# Patient Record
Sex: Male | Born: 1950 | Race: White | Hispanic: No | Smoking: Never smoker
Health system: Southern US, Community
[De-identification: ages and names within clinical notes are randomized; demographics above are authoritative.]

## PROBLEM LIST (undated history)

## (undated) DIAGNOSIS — F32A Depression, unspecified: Secondary | ICD-10-CM

## (undated) DIAGNOSIS — J45909 Unspecified asthma, uncomplicated: Secondary | ICD-10-CM

## (undated) DIAGNOSIS — I38 Endocarditis, valve unspecified: Secondary | ICD-10-CM

## (undated) DIAGNOSIS — I499 Cardiac arrhythmia, unspecified: Secondary | ICD-10-CM

## (undated) DIAGNOSIS — M0500 Felty's syndrome, unspecified site: Secondary | ICD-10-CM

## (undated) DIAGNOSIS — R222 Localized swelling, mass and lump, trunk: Secondary | ICD-10-CM

## (undated) DIAGNOSIS — G4730 Sleep apnea, unspecified: Secondary | ICD-10-CM

## (undated) DIAGNOSIS — I739 Peripheral vascular disease, unspecified: Secondary | ICD-10-CM

## (undated) DIAGNOSIS — H04123 Dry eye syndrome of bilateral lacrimal glands: Secondary | ICD-10-CM

## (undated) DIAGNOSIS — R011 Cardiac murmur, unspecified: Secondary | ICD-10-CM

## (undated) DIAGNOSIS — H269 Unspecified cataract: Secondary | ICD-10-CM

## (undated) DIAGNOSIS — E785 Hyperlipidemia, unspecified: Secondary | ICD-10-CM

## (undated) DIAGNOSIS — D649 Anemia, unspecified: Secondary | ICD-10-CM

## (undated) DIAGNOSIS — F329 Major depressive disorder, single episode, unspecified: Secondary | ICD-10-CM

## (undated) DIAGNOSIS — N400 Enlarged prostate without lower urinary tract symptoms: Secondary | ICD-10-CM

## (undated) DIAGNOSIS — I219 Acute myocardial infarction, unspecified: Secondary | ICD-10-CM

## (undated) DIAGNOSIS — Z7952 Long term (current) use of systemic steroids: Secondary | ICD-10-CM

## (undated) DIAGNOSIS — I10 Essential (primary) hypertension: Secondary | ICD-10-CM

## (undated) DIAGNOSIS — H15102 Unspecified episcleritis, left eye: Secondary | ICD-10-CM

## (undated) DIAGNOSIS — J329 Chronic sinusitis, unspecified: Secondary | ICD-10-CM

## (undated) DIAGNOSIS — M1990 Unspecified osteoarthritis, unspecified site: Secondary | ICD-10-CM

## (undated) DIAGNOSIS — B027 Disseminated zoster: Secondary | ICD-10-CM

## (undated) DIAGNOSIS — K219 Gastro-esophageal reflux disease without esophagitis: Secondary | ICD-10-CM

## (undated) DIAGNOSIS — N189 Chronic kidney disease, unspecified: Secondary | ICD-10-CM

## (undated) DIAGNOSIS — G709 Myoneural disorder, unspecified: Secondary | ICD-10-CM

## (undated) DIAGNOSIS — L509 Urticaria, unspecified: Secondary | ICD-10-CM

## (undated) DIAGNOSIS — L88 Pyoderma gangrenosum: Secondary | ICD-10-CM

## (undated) DIAGNOSIS — I2510 Atherosclerotic heart disease of native coronary artery without angina pectoris: Secondary | ICD-10-CM

## (undated) DIAGNOSIS — D696 Thrombocytopenia, unspecified: Secondary | ICD-10-CM

## (undated) DIAGNOSIS — M310 Hypersensitivity angiitis: Secondary | ICD-10-CM

## (undated) DIAGNOSIS — Z8614 Personal history of Methicillin resistant Staphylococcus aureus infection: Secondary | ICD-10-CM

## (undated) DIAGNOSIS — H9193 Unspecified hearing loss, bilateral: Secondary | ICD-10-CM

## (undated) DIAGNOSIS — D689 Coagulation defect, unspecified: Secondary | ICD-10-CM

## (undated) HISTORY — PX: CORONARY ARTERY BYPASS GRAFT: SHX141

## (undated) HISTORY — PX: OTHER SURGICAL HISTORY: SHX169

## (undated) HISTORY — PX: CARDIAC CATHETERIZATION: SHX172

---

## 2006-05-27 ENCOUNTER — Ambulatory Visit: Payer: Self-pay | Admitting: Unknown Physician Specialty

## 2006-10-11 ENCOUNTER — Ambulatory Visit: Payer: Self-pay | Admitting: Otolaryngology

## 2006-10-20 ENCOUNTER — Ambulatory Visit: Payer: Self-pay | Admitting: Otolaryngology

## 2007-10-14 ENCOUNTER — Ambulatory Visit: Payer: Self-pay | Admitting: Otolaryngology

## 2008-06-19 ENCOUNTER — Ambulatory Visit: Payer: Self-pay | Admitting: Urology

## 2008-06-25 ENCOUNTER — Ambulatory Visit: Payer: Self-pay | Admitting: Urology

## 2009-10-05 DIAGNOSIS — Z951 Presence of aortocoronary bypass graft: Secondary | ICD-10-CM | POA: Insufficient documentation

## 2009-10-05 DIAGNOSIS — Z952 Presence of prosthetic heart valve: Secondary | ICD-10-CM | POA: Insufficient documentation

## 2010-02-14 ENCOUNTER — Observation Stay: Payer: Self-pay | Admitting: Internal Medicine

## 2010-05-20 ENCOUNTER — Encounter: Payer: Self-pay | Admitting: Cardiovascular Disease

## 2010-06-05 ENCOUNTER — Encounter: Payer: Self-pay | Admitting: Cardiovascular Disease

## 2010-07-05 ENCOUNTER — Encounter: Payer: Self-pay | Admitting: Cardiovascular Disease

## 2010-08-05 ENCOUNTER — Encounter: Payer: Self-pay | Admitting: Cardiovascular Disease

## 2011-08-10 DIAGNOSIS — I10 Essential (primary) hypertension: Secondary | ICD-10-CM | POA: Insufficient documentation

## 2011-08-10 DIAGNOSIS — M0609 Rheumatoid arthritis without rheumatoid factor, multiple sites: Secondary | ICD-10-CM | POA: Insufficient documentation

## 2011-08-10 DIAGNOSIS — Z953 Presence of xenogenic heart valve: Secondary | ICD-10-CM | POA: Insufficient documentation

## 2011-08-10 DIAGNOSIS — M0500 Felty's syndrome, unspecified site: Secondary | ICD-10-CM | POA: Insufficient documentation

## 2011-10-13 DIAGNOSIS — E785 Hyperlipidemia, unspecified: Secondary | ICD-10-CM | POA: Insufficient documentation

## 2011-11-09 DIAGNOSIS — I4891 Unspecified atrial fibrillation: Secondary | ICD-10-CM | POA: Insufficient documentation

## 2011-11-09 DIAGNOSIS — I25810 Atherosclerosis of coronary artery bypass graft(s) without angina pectoris: Secondary | ICD-10-CM | POA: Insufficient documentation

## 2013-02-02 DIAGNOSIS — I70219 Atherosclerosis of native arteries of extremities with intermittent claudication, unspecified extremity: Secondary | ICD-10-CM | POA: Insufficient documentation

## 2013-11-17 DIAGNOSIS — R1900 Intra-abdominal and pelvic swelling, mass and lump, unspecified site: Secondary | ICD-10-CM | POA: Insufficient documentation

## 2014-12-17 DIAGNOSIS — D696 Thrombocytopenia, unspecified: Secondary | ICD-10-CM | POA: Insufficient documentation

## 2015-06-18 DIAGNOSIS — Z79899 Other long term (current) drug therapy: Secondary | ICD-10-CM | POA: Insufficient documentation

## 2016-03-03 DIAGNOSIS — Z7952 Long term (current) use of systemic steroids: Secondary | ICD-10-CM | POA: Insufficient documentation

## 2016-03-23 DIAGNOSIS — R7689 Other specified abnormal immunological findings in serum: Secondary | ICD-10-CM | POA: Insufficient documentation

## 2016-03-23 DIAGNOSIS — R768 Other specified abnormal immunological findings in serum: Secondary | ICD-10-CM | POA: Insufficient documentation

## 2016-08-19 ENCOUNTER — Encounter: Admission: RE | Payer: Self-pay | Source: Ambulatory Visit

## 2016-08-19 ENCOUNTER — Ambulatory Visit
Admission: RE | Admit: 2016-08-19 | Payer: Medicare Other | Source: Ambulatory Visit | Admitting: Unknown Physician Specialty

## 2016-08-19 SURGERY — COLONOSCOPY WITH PROPOFOL
Anesthesia: General

## 2016-11-06 ENCOUNTER — Encounter: Payer: Self-pay | Admitting: *Deleted

## 2016-11-09 ENCOUNTER — Ambulatory Visit: Payer: Medicare Other | Admitting: Anesthesiology

## 2016-11-09 ENCOUNTER — Encounter: Payer: Self-pay | Admitting: *Deleted

## 2016-11-09 ENCOUNTER — Encounter
Admission: RE | Disposition: A | Discharge status: Home / Self Care | Payer: Self-pay | Source: Ambulatory Visit | Attending: Unknown Physician Specialty

## 2016-11-09 ENCOUNTER — Ambulatory Visit
Admission: RE | Admit: 2016-11-09 | Discharge: 2016-11-09 | Disposition: A | Discharge status: Home / Self Care | Payer: Medicare Other | Source: Ambulatory Visit | Attending: Unknown Physician Specialty | Admitting: Unknown Physician Specialty

## 2016-11-09 DIAGNOSIS — Z7952 Long term (current) use of systemic steroids: Secondary | ICD-10-CM | POA: Insufficient documentation

## 2016-11-09 DIAGNOSIS — I2510 Atherosclerotic heart disease of native coronary artery without angina pectoris: Secondary | ICD-10-CM | POA: Diagnosis not present

## 2016-11-09 DIAGNOSIS — N189 Chronic kidney disease, unspecified: Secondary | ICD-10-CM | POA: Diagnosis not present

## 2016-11-09 DIAGNOSIS — K5730 Diverticulosis of large intestine without perforation or abscess without bleeding: Secondary | ICD-10-CM | POA: Insufficient documentation

## 2016-11-09 DIAGNOSIS — D122 Benign neoplasm of ascending colon: Secondary | ICD-10-CM | POA: Diagnosis not present

## 2016-11-09 DIAGNOSIS — K228 Other specified diseases of esophagus: Secondary | ICD-10-CM | POA: Diagnosis not present

## 2016-11-09 DIAGNOSIS — Z952 Presence of prosthetic heart valve: Secondary | ICD-10-CM | POA: Diagnosis not present

## 2016-11-09 DIAGNOSIS — G4730 Sleep apnea, unspecified: Secondary | ICD-10-CM | POA: Diagnosis not present

## 2016-11-09 DIAGNOSIS — I252 Old myocardial infarction: Secondary | ICD-10-CM | POA: Insufficient documentation

## 2016-11-09 DIAGNOSIS — Z951 Presence of aortocoronary bypass graft: Secondary | ICD-10-CM | POA: Diagnosis not present

## 2016-11-09 DIAGNOSIS — Z6822 Body mass index (BMI) 22.0-22.9, adult: Secondary | ICD-10-CM | POA: Insufficient documentation

## 2016-11-09 DIAGNOSIS — E785 Hyperlipidemia, unspecified: Secondary | ICD-10-CM | POA: Insufficient documentation

## 2016-11-09 DIAGNOSIS — I129 Hypertensive chronic kidney disease with stage 1 through stage 4 chronic kidney disease, or unspecified chronic kidney disease: Secondary | ICD-10-CM | POA: Insufficient documentation

## 2016-11-09 DIAGNOSIS — F329 Major depressive disorder, single episode, unspecified: Secondary | ICD-10-CM | POA: Diagnosis not present

## 2016-11-09 DIAGNOSIS — R634 Abnormal weight loss: Secondary | ICD-10-CM | POA: Insufficient documentation

## 2016-11-09 DIAGNOSIS — K3189 Other diseases of stomach and duodenum: Secondary | ICD-10-CM | POA: Insufficient documentation

## 2016-11-09 DIAGNOSIS — K644 Residual hemorrhoidal skin tags: Secondary | ICD-10-CM | POA: Diagnosis not present

## 2016-11-09 DIAGNOSIS — K641 Second degree hemorrhoids: Secondary | ICD-10-CM | POA: Insufficient documentation

## 2016-11-09 DIAGNOSIS — D649 Anemia, unspecified: Secondary | ICD-10-CM | POA: Diagnosis not present

## 2016-11-09 DIAGNOSIS — Z79899 Other long term (current) drug therapy: Secondary | ICD-10-CM | POA: Diagnosis not present

## 2016-11-09 DIAGNOSIS — R112 Nausea with vomiting, unspecified: Secondary | ICD-10-CM | POA: Diagnosis present

## 2016-11-09 DIAGNOSIS — K219 Gastro-esophageal reflux disease without esophagitis: Secondary | ICD-10-CM | POA: Diagnosis not present

## 2016-11-09 DIAGNOSIS — I739 Peripheral vascular disease, unspecified: Secondary | ICD-10-CM | POA: Insufficient documentation

## 2016-11-09 HISTORY — DX: Chronic sinusitis, unspecified: J32.9

## 2016-11-09 HISTORY — DX: Atherosclerotic heart disease of native coronary artery without angina pectoris: I25.10

## 2016-11-09 HISTORY — DX: Coagulation defect, unspecified: D68.9

## 2016-11-09 HISTORY — DX: Pyoderma gangrenosum: L88

## 2016-11-09 HISTORY — DX: Disseminated zoster: B02.7

## 2016-11-09 HISTORY — DX: Chronic kidney disease, unspecified: N18.9

## 2016-11-09 HISTORY — DX: Dry eye syndrome of bilateral lacrimal glands: H04.123

## 2016-11-09 HISTORY — DX: Endocarditis, valve unspecified: I38

## 2016-11-09 HISTORY — DX: Unspecified cataract: H26.9

## 2016-11-09 HISTORY — DX: Depression, unspecified: F32.A

## 2016-11-09 HISTORY — DX: Peripheral vascular disease, unspecified: I73.9

## 2016-11-09 HISTORY — DX: Hyperlipidemia, unspecified: E78.5

## 2016-11-09 HISTORY — DX: Long term (current) use of systemic steroids: Z79.52

## 2016-11-09 HISTORY — DX: Felty's syndrome, unspecified site: M05.00

## 2016-11-09 HISTORY — DX: Gastro-esophageal reflux disease without esophagitis: K21.9

## 2016-11-09 HISTORY — DX: Localized swelling, mass and lump, trunk: R22.2

## 2016-11-09 HISTORY — DX: Unspecified episcleritis, left eye: H15.102

## 2016-11-09 HISTORY — PX: ESOPHAGOGASTRODUODENOSCOPY (EGD) WITH PROPOFOL: SHX5813

## 2016-11-09 HISTORY — DX: Sleep apnea, unspecified: G47.30

## 2016-11-09 HISTORY — DX: Hypersensitivity angiitis: M31.0

## 2016-11-09 HISTORY — DX: Myoneural disorder, unspecified: G70.9

## 2016-11-09 HISTORY — DX: Cardiac murmur, unspecified: R01.1

## 2016-11-09 HISTORY — DX: Personal history of Methicillin resistant Staphylococcus aureus infection: Z86.14

## 2016-11-09 HISTORY — DX: Unspecified asthma, uncomplicated: J45.909

## 2016-11-09 HISTORY — DX: Major depressive disorder, single episode, unspecified: F32.9

## 2016-11-09 HISTORY — DX: Urticaria, unspecified: L50.9

## 2016-11-09 HISTORY — DX: Benign prostatic hyperplasia without lower urinary tract symptoms: N40.0

## 2016-11-09 HISTORY — DX: Cardiac arrhythmia, unspecified: I49.9

## 2016-11-09 HISTORY — DX: Thrombocytopenia, unspecified: D69.6

## 2016-11-09 HISTORY — DX: Essential (primary) hypertension: I10

## 2016-11-09 HISTORY — DX: Acute myocardial infarction, unspecified: I21.9

## 2016-11-09 HISTORY — DX: Unspecified osteoarthritis, unspecified site: M19.90

## 2016-11-09 HISTORY — DX: Unspecified hearing loss, bilateral: H91.93

## 2016-11-09 HISTORY — PX: COLONOSCOPY WITH PROPOFOL: SHX5780

## 2016-11-09 HISTORY — DX: Anemia, unspecified: D64.9

## 2016-11-09 LAB — CBC WITH DIFFERENTIAL/PLATELET
Basophils Absolute: 0 10*3/uL (ref 0–0.1)
Basophils Relative: 0 %
Eosinophils Absolute: 0 10*3/uL (ref 0–0.7)
Eosinophils Relative: 0 %
HEMATOCRIT: 34.4 % — AB (ref 40.0–52.0)
HEMOGLOBIN: 11.5 g/dL — AB (ref 13.0–18.0)
LYMPHS ABS: 0.3 10*3/uL — AB (ref 1.0–3.6)
Lymphocytes Relative: 17 %
MCH: 25.4 pg — AB (ref 26.0–34.0)
MCHC: 33.4 g/dL (ref 32.0–36.0)
MCV: 76 fL — AB (ref 80.0–100.0)
MONOS PCT: 10 %
Monocytes Absolute: 0.2 10*3/uL (ref 0.2–1.0)
NEUTROS ABS: 1.2 10*3/uL — AB (ref 1.4–6.5)
NEUTROS PCT: 73 %
Platelets: 66 10*3/uL — ABNORMAL LOW (ref 150–440)
RBC: 4.53 MIL/uL (ref 4.40–5.90)
RDW: 16.5 % — ABNORMAL HIGH (ref 11.5–14.5)
WBC: 1.6 10*3/uL — ABNORMAL LOW (ref 3.8–10.6)

## 2016-11-09 SURGERY — COLONOSCOPY WITH PROPOFOL
Anesthesia: General

## 2016-11-09 MED ORDER — EPHEDRINE SULFATE 50 MG/ML IJ SOLN
INTRAMUSCULAR | Status: DC | PRN
  Administered 2016-11-09 (×2): 10 mg via INTRAVENOUS
  Administered 2016-11-09: 15 mg via INTRAVENOUS
  Administered 2016-11-09: 10 mg via INTRAVENOUS

## 2016-11-09 MED ORDER — SODIUM CHLORIDE 0.9 % IV SOLN
INTRAVENOUS | Status: DC
  Administered 2016-11-09: 1000 mL via INTRAVENOUS

## 2016-11-09 MED ORDER — PIPERACILLIN-TAZOBACTAM 3.375 G IVPB 30 MIN
3.3750 g | Freq: Once | INTRAVENOUS | Status: AC
  Administered 2016-11-09: 3.375 g via INTRAVENOUS
  Filled 2016-11-09: qty 50

## 2016-11-09 MED ORDER — PROPOFOL 10 MG/ML IV BOLUS
INTRAVENOUS | Status: DC | PRN
  Administered 2016-11-09 (×2): 20 mg via INTRAVENOUS
  Administered 2016-11-09: 10 mg via INTRAVENOUS
  Administered 2016-11-09: 20 mg via INTRAVENOUS
  Administered 2016-11-09: 30 mg via INTRAVENOUS
  Administered 2016-11-09 (×2): 20 mg via INTRAVENOUS

## 2016-11-09 MED ORDER — PROPOFOL 500 MG/50ML IV EMUL
INTRAVENOUS | Status: DC | PRN
  Administered 2016-11-09: 50 ug/kg/min via INTRAVENOUS

## 2016-11-09 MED ORDER — PROPOFOL 500 MG/50ML IV EMUL
INTRAVENOUS | Status: AC
  Filled 2016-11-09: qty 50

## 2016-11-09 MED ORDER — FENTANYL CITRATE (PF) 100 MCG/2ML IJ SOLN
INTRAMUSCULAR | Status: AC
  Filled 2016-11-09: qty 2

## 2016-11-09 MED ORDER — PROPOFOL 10 MG/ML IV BOLUS
INTRAVENOUS | Status: AC
  Filled 2016-11-09: qty 20

## 2016-11-09 MED ORDER — MIDAZOLAM HCL 2 MG/2ML IJ SOLN
INTRAMUSCULAR | Status: AC
  Filled 2016-11-09: qty 2

## 2016-11-09 MED ORDER — LIDOCAINE HCL (PF) 2 % IJ SOLN
INTRAMUSCULAR | Status: DC | PRN
  Administered 2016-11-09: 50 mg

## 2016-11-09 NOTE — Anesthesia Postprocedure Evaluation (Signed)
Anesthesia Post Note  Patient: Benjamin Richards  Procedure(s) Performed: Procedure(s) (LRB): COLONOSCOPY WITH PROPOFOL (N/A) ESOPHAGOGASTRODUODENOSCOPY (EGD) WITH PROPOFOL (N/A)  Patient location during evaluation: Endoscopy Anesthesia Type: General Level of consciousness: awake and alert Pain management: pain level controlled Vital Signs Assessment: post-procedure vital signs reviewed and stable Respiratory status: spontaneous breathing and respiratory function stable Cardiovascular status: stable Anesthetic complications: no     Last Vitals:  Vitals:   11/09/16 1242 11/09/16 1518  BP: 112/64 (!) 98/47  Pulse: (!) 58   Resp: 18   Temp: 36.3 C 36.4 C    Last Pain:  Vitals:   11/09/16 1518  TempSrc: Tympanic                 KEPHART,WILLIAM K

## 2016-11-09 NOTE — Op Note (Signed)
[facility] Gastroenterology Patient Name: Benjamin Richards Procedure Date: 11/09/2016 2:27 PM MRN: [id number] Account #: [id number] Date of Birth: 1951/05/12 Admit Type: Outpatient Age: 66 Room: [facility] ENDO ROOM 1 Gender: Male Note Status: Finalized Procedure:            Upper GI endoscopy Indications:          Nausea with vomiting, Weight loss Providers:            Manya Silvas, MD Referring MD:         Leonie Douglas. Doy Hutching, MD (Referring MD) Medicines:            Propofol per Anesthesia Complications:        No immediate complications. Procedure:            Pre-Anesthesia Assessment:                       - After reviewing the risks and benefits, the patient                        was deemed in satisfactory condition to undergo the                        procedure.                       After obtaining informed consent, the endoscope was                        passed under direct vision. Throughout the procedure,                        the patient's blood pressure, pulse, and oxygen                        saturations were monitored continuously. The                        Colonoscope was introduced through the mouth, and                        advanced to the second part of duodenum. The upper GI                        endoscopy was accomplished without difficulty. The                        patient tolerated the procedure well. Findings:      The Z-line was irregular and was found 44 cm from the incisors.      Localized mildly erythematous mucosa without bleeding was found in the       gastric antrum.      The duodenal bulb was normal.      A single diminutive nodule was found in the second portion of the       duodenum. Due to the small size of this and his platelet count of 66K I       decided to leave this alone. Impression:           - Z-line irregular, 44 cm from the incisors.                       -  Erythematous mucosa in the antrum.  - Normal duodenal bulb.                       - Nodule found in the duodenum.                       - No specimens collected. Recommendation:       - Perform a colonoscopy as previously scheduled. Manya Silvas, MD 11/09/2016 2:44:34 PM This report has been signed electronically. Number of Addenda: 0 Note Initiated On: 11/09/2016 2:27 PM      [facility]

## 2016-11-09 NOTE — Anesthesia Preprocedure Evaluation (Signed)
Anesthesia Evaluation  Patient identified by MRN, date of birth, ID band Patient awake    Reviewed: Allergy & Precautions, H&P , NPO status , Patient's Chart, lab work & pertinent test results, reviewed documented beta blocker date and time   History of Anesthesia Complications Negative for: history of anesthetic complications  Airway Mallampati: I  TM Distance: >3 FB Neck ROM: full    Dental  (+) Caps, Teeth Intact   Pulmonary neg shortness of breath, asthma (as a child) , sleep apnea , neg COPD, Recent URI ,           Cardiovascular Exercise Tolerance: Good hypertension, (-) angina+ CAD, + Past MI, + CABG and + Peripheral Vascular Disease  (-) Cardiac Stents + dysrhythmias Atrial Fibrillation + Valvular Problems/Murmurs (s/p AVR and mitral valve repair)      Neuro/Psych PSYCHIATRIC DISORDERS (Depression) negative neurological ROS     GI/Hepatic Neg liver ROS, GERD  ,  Endo/Other  negative endocrine ROS  Renal/GU CRFRenal disease  negative genitourinary   Musculoskeletal  (+) Arthritis , Rheumatoid disorders,    Abdominal   Peds  Hematology  (+) anemia ,   Anesthesia Other Findings Past Medical History: No date: Abdominal wall mass No date: Anemia No date: Arthritis     Comment: rheumatoid arthritis of multiple sites No date: Asthma No date: BPH (benign prostatic hyperplasia) No date: Cataracts, both eyes No date: Chronic kidney disease No date: Clotting disorder (HCC) No date: Coronary artery disease No date: Current chronic use of systemic steroids No date: Depression No date: Disseminated zoster No date: Dry eyes, bilateral No date: Dysrhythmia     Comment: atrial fib. 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) No date: GERD (gastroesophageal reflux disease) No date: Hearing loss associated with syndrome, bilater* No date: Heart murmur No date: History of methicillin resistant  staphylococcu* No date: Hyperlipemia No date: Hypertension No date: Leukocytoclastic vasculitis (HCC) No date: Myocardial infarction No date: Neuromuscular disorder (HCC) No date: Peripheral vascular disease (HCC)     Comment: atherosclerosis of native artery of extremity No date: Pyoderma gangrenosum No date: Sinusitis No date: Sleep apnea No date: Thrombocytopenia (HCC) No date: Urticaria No date: Valvular disease   Reproductive/Obstetrics negative OB ROS                             Anesthesia Physical Anesthesia Plan  ASA: IV  Anesthesia Plan: General   Post-op Pain Management:    Induction:   Airway Management Planned:   Additional Equipment:   Intra-op Plan:   Post-operative Plan:   Informed Consent: I have reviewed the patients History and Physical, chart, labs and discussed the procedure including the risks, benefits and alternatives for the proposed anesthesia with the patient or authorized representative who has indicated his/her understanding and acceptance.   Dental Advisory Given  Plan Discussed with: Anesthesiologist, CRNA and Surgeon  Anesthesia Plan Comments:         Anesthesia Quick Evaluation

## 2016-11-09 NOTE — Anesthesia Post-op Follow-up Note (Cosign Needed)
Anesthesia QCDR form completed.        

## 2016-11-09 NOTE — Transfer of Care (Signed)
Immediate Anesthesia Transfer of Care Note  Patient: Benjamin Richards  Procedure(s) Performed: Procedure(s): COLONOSCOPY WITH PROPOFOL (N/A) ESOPHAGOGASTRODUODENOSCOPY (EGD) WITH PROPOFOL (N/A)  Patient Location: Endoscopy Unit  Anesthesia Type:General  Level of Consciousness: patient cooperative and responds to stimulation  Airway & Oxygen Therapy: Patient Spontanous Breathing and Patient connected to nasal cannula oxygen  Post-op Assessment: Report given to RN, Post -op Vital signs reviewed and stable and Patient moving all extremities X 4  Post vital signs: Reviewed and stable  Last Vitals:  Vitals:   11/09/16 1242 11/09/16 1518  BP: 112/64 (!) 98/47  Pulse: (!) 58   Resp: 18   Temp: 36.3 C 36.4 C    Last Pain:  Vitals:   11/09/16 1518  TempSrc: Tympanic         Complications: No apparent anesthesia complications

## 2016-11-09 NOTE — Op Note (Signed)
[facility] Gastroenterology Patient Name: Benjamin Richards Procedure Date: 11/09/2016 2:27 PM MRN: [id number] Account #: [id number] Date of Birth: [birth year] Admit Type: Outpatient Age: 66 Room: [facility] ENDO ROOM 1 Gender: Male Note Status: Finalized Procedure:            Colonoscopy Indications:          Screening for colorectal malignant neoplasm Providers:            Manya Silvas, MD Referring MD:         Leonie Douglas. Doy Hutching, MD (Referring MD) Medicines:            Propofol per Anesthesia Complications:        No immediate complications. Procedure:            Pre-Anesthesia Assessment:                       - After reviewing the risks and benefits, the patient                        was deemed in satisfactory condition to undergo the                        procedure.                       After obtaining informed consent, the colonoscope was                        passed under direct vision. Throughout the procedure,                        the patient's blood pressure, pulse, and oxygen                        saturations were monitored continuously. The Olympus                        PCF-H180AL colonoscope ( S#: [id number] ) was introduced                        through the anus and advanced to the the cecum,                        identified by appendiceal orifice and ileocecal valve.                        The colonoscopy was performed without difficulty. The                        patient tolerated the procedure well. The quality of                        the bowel preparation was excellent. Findings:      A diminutive polyp was found in the proximal ascending colon. The polyp       was sessile. The polyp was removed with a cold biopsy forceps. Resection       and retrieval were complete. To prevent bleeding after the polypectomy,       one hemostatic clip was successfully placed. There was no bleeding at       the  end of the procedure.      A few small-mouthed  diverticula were found in the sigmoid colon.      Internal hemorrhoids were found during endoscopy. The hemorrhoids were       moderate, medium-sized and Grade II (internal hemorrhoids that prolapse       but reduce spontaneously).      External hemorrhoids were found during endoscopy. The hemorrhoids were       medium-sized. Impression:           - One diminutive polyp in the proximal ascending colon,                        removed with a cold biopsy forceps. Resected and                        retrieved. Clip was placed.                       - Diverticulosis in the sigmoid colon.                       - Internal hemorrhoids.                       - External hemorrhoids. Recommendation:       - Await pathology results. Manya Silvas, MD 11/09/2016 3:16:56 PM This report has been signed electronically. Number of Addenda: 0 Note Initiated On: 11/09/2016 2:27 PM Scope Withdrawal Time: 0 hours 12 minutes 26 seconds  Total Procedure Duration: 0 hours 26 minutes 20 seconds       [facility]

## 2016-11-09 NOTE — H&P (Signed)
Primary Care Physician:  Idelle Crouch, MD Primary Gastroenterologist:  Dr. Vira Agar  Pre-Procedure History & Physical: HPI:  Benjamin Richards is a 66 y.o. male is here for an endoscopy and colonoscopy.   Past Medical History:  Diagnosis Date  . Abdominal wall mass   . Anemia   . Arthritis    rheumatoid arthritis of multiple sites  . Asthma   . BPH (benign prostatic hyperplasia)   . Cataracts, both eyes   . Chronic kidney disease   . Clotting disorder ([place])   . Coronary artery disease   . Current chronic use of systemic steroids   . Depression   . Disseminated zoster   . Dry eyes, bilateral   . Dysrhythmia    atrial fib.  Marland Kitchen Episcleritis, left   . 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[place])   . GERD (gastroesophageal reflux disease)   . Hearing loss associated with syndrome, bilateral   . Heart murmur   . History of methicillin resistant staphylococcus aureus (MRSA)   . Hyperlipemia   . Hypertension   . Leukocytoclastic vasculitis ([place])   . Myocardial infarction   . Neuromuscular disorder ([place])   . Peripheral vascular disease (HCC)    atherosclerosis of native artery of extremity  . Pyoderma gangrenosum   . Sinusitis   . Sleep apnea   . Thrombocytopenia ([place])   . Urticaria   . Valvular disease     Past Surgical History:  Procedure Laterality Date  . arthropathy     rotator cuff  . CARDIAC CATHETERIZATION    . CORONARY ARTERY BYPASS GRAFT      Prior to Admission medications   Medication Sig Start Date End Date Taking? Authorizing Provider  acetaminophen (TYLENOL) 500 MG tablet Take 500 mg by mouth every 6 (six) hours as needed.   Yes Historical Provider, MD  amLODipine (NORVASC) 5 MG tablet Take 5 mg by mouth daily.   Yes Historical Provider, MD  Cholecalciferol 1000 units capsule Take 5,000 Units by mouth daily.   Yes Historical Provider, MD  citalopram (CELEXA) 20 MG tablet Take 20 mg by mouth daily.   Yes Historical Provider, MD  diphenhydrAMINE (BENADRYL) 25 MG tablet Take 25  mg by mouth at bedtime as needed.   Yes Historical Provider, MD  docusate sodium (COLACE) 100 MG capsule Take 100 mg by mouth daily.   Yes Historical Provider, MD  dronabinol (MARINOL) 2.5 MG capsule Take 2.5 mg by mouth 2 (two) times daily before lunch and supper.   Yes Historical Provider, MD  Ferrous Sulfate (SLOW FE PO) Take 47.5 mg by mouth daily.   Yes Historical Provider, MD  FLAXSEED, LINSEED, PO Take 1,200 mg by mouth daily.   Yes Historical Provider, MD  immune globulin, human, (CARIMUNE NF) 12 g injection Inject into the vein once.   Yes Historical Provider, MD  loratadine (CLARITIN) 10 MG tablet Take 10 mg by mouth daily.   Yes Historical Provider, MD  losartan (COZAAR) 25 MG tablet Take 25 mg by mouth daily.   Yes Historical Provider, MD  metoprolol tartrate (LOPRESSOR) 25 MG tablet Take 25 mg by mouth 2 (two) times daily.   Yes Historical Provider, MD  pantoprazole (PROTONIX) 40 MG tablet Take 40 mg by mouth daily.   Yes Historical Provider, MD  predniSONE (DELTASONE) 1 MG tablet Take 1 mg by mouth daily with breakfast.   Yes Historical Provider, MD  predniSONE (DELTASONE) 5 MG tablet Take 5 mg by mouth daily with breakfast.  Yes Historical Provider, MD  riTUXimab (RITUXAN) 100 MG/10ML injection Inject into the vein.   Yes Historical Provider, MD  tiZANidine (ZANAFLEX) 2 MG tablet Take 2 mg by mouth once.   Yes Historical Provider, MD  valACYclovir (VALTREX) 500 MG tablet Take 500 mg by mouth 2 (two) times daily.   Yes Historical Provider, MD  vitamin C (ASCORBIC ACID) 500 MG tablet Take 500 mg by mouth daily.   Yes Historical Provider, MD  amoxicillin (AMOXIL) 500 MG tablet Take 500 mg by mouth 2 (two) times daily.    Historical Provider, MD  guaiFENesin (ROBITUSSIN) 100 MG/5ML liquid Take 200 mg by mouth 3 (three) times daily as needed for cough.    Historical Provider, MD    Allergies as of 10/16/2016  . (Not on File)    History reviewed. No pertinent family  history.  Social History   Social History  . Marital status: Married    Spouse name: N/A  . Number of children: N/A  . Years of education: N/A   Occupational History  . Not on file.   Social History Main Topics  . Smoking status: Never Smoker  . Smokeless tobacco: Never Used  . Alcohol use No  . Drug use: No  . Sexual activity: Not on file   Other Topics Concern  . Not on file   Social History Narrative  . No narrative on file    Review of Systems: See HPI, otherwise negative ROS  Physical Exam: BP 112/64   Pulse (!) 58   Temp 97.4 F (36.3 C) (Tympanic)   Resp 18   Ht 5\' 10"  (1.778 m)   Wt 70.8 kg (156 lb)   SpO2 100%   BMI 22.38 kg/m  General:   Alert,  pleasant and cooperative in NAD Head:  Normocephalic and atraumatic. Neck:  Supple; no masses or thyromegaly. Lungs:  Clear throughout to auscultation.    Heart:  Regular rate and rhythm. Abdomen:  Soft, nontender and nondistended. Normal bowel sounds, without guarding, and without rebound.   Neurologic:  Alert and  oriented x4;  grossly normal neurologically.  Impression/Plan: [name] is here for an endoscopy and colonoscopy to be performed for screening colonoscopy and weight loss, nausea,vomiting.  Risks, benefits, limitations, and alternatives regarding  endoscopy and colonoscopy have been reviewed with the patient.  Questions have been answered.  All parties agreeable.   Gaylyn Cheers, MD  11/09/2016, 1:21 PM

## 2016-11-10 ENCOUNTER — Encounter: Payer: Self-pay | Admitting: Unknown Physician Specialty

## 2016-11-11 LAB — SURGICAL PATHOLOGY

## 2016-12-17 DIAGNOSIS — C91Z0 Other lymphoid leukemia not having achieved remission: Secondary | ICD-10-CM | POA: Insufficient documentation

## 2017-09-07 DIAGNOSIS — L959 Vasculitis limited to the skin, unspecified: Secondary | ICD-10-CM | POA: Insufficient documentation

## 2017-11-30 DIAGNOSIS — Z7960 Long term (current) use of unspecified immunomodulators and immunosuppressants: Secondary | ICD-10-CM | POA: Insufficient documentation

## 2017-11-30 DIAGNOSIS — M21619 Bunion of unspecified foot: Secondary | ICD-10-CM | POA: Insufficient documentation

## 2017-12-21 DIAGNOSIS — G8929 Other chronic pain: Secondary | ICD-10-CM | POA: Insufficient documentation

## 2018-02-22 DIAGNOSIS — I716 Thoracoabdominal aortic aneurysm, without rupture, unspecified: Secondary | ICD-10-CM | POA: Insufficient documentation

## 2018-05-17 DIAGNOSIS — D61818 Other pancytopenia: Secondary | ICD-10-CM | POA: Insufficient documentation

## 2018-05-20 DIAGNOSIS — I999 Unspecified disorder of circulatory system: Secondary | ICD-10-CM | POA: Insufficient documentation

## 2018-06-24 ENCOUNTER — Encounter: Admission: RE | Payer: Self-pay | Source: Ambulatory Visit

## 2018-06-24 ENCOUNTER — Ambulatory Visit
Admission: RE | Admit: 2018-06-24 | Payer: Medicare Other | Source: Ambulatory Visit | Admitting: Unknown Physician Specialty

## 2018-06-24 SURGERY — ESOPHAGOGASTRODUODENOSCOPY (EGD) WITH PROPOFOL
Anesthesia: General

## 2019-01-14 DIAGNOSIS — N1831 Chronic kidney disease, stage 3a: Secondary | ICD-10-CM | POA: Insufficient documentation

## 2019-01-17 DIAGNOSIS — N400 Enlarged prostate without lower urinary tract symptoms: Secondary | ICD-10-CM | POA: Insufficient documentation

## 2019-04-28 DIAGNOSIS — R972 Elevated prostate specific antigen [PSA]: Secondary | ICD-10-CM | POA: Insufficient documentation

## 2019-05-04 ENCOUNTER — Encounter: Payer: Self-pay | Admitting: Emergency Medicine

## 2019-05-04 ENCOUNTER — Other Ambulatory Visit: Payer: Self-pay

## 2019-05-04 ENCOUNTER — Ambulatory Visit: Payer: Medicare Other

## 2019-05-04 ENCOUNTER — Ambulatory Visit
Admission: EM | Admit: 2019-05-04 | Discharge: 2019-05-04 | Disposition: A | Discharge status: Home / Self Care | Payer: Medicare Other | Attending: Family Medicine | Admitting: Family Medicine

## 2019-05-04 DIAGNOSIS — M79672 Pain in left foot: Secondary | ICD-10-CM | POA: Diagnosis present

## 2019-05-04 DIAGNOSIS — W0110XA Fall on same level from slipping, tripping and stumbling with subsequent striking against unspecified object, initial encounter: Secondary | ICD-10-CM | POA: Diagnosis not present

## 2019-05-04 DIAGNOSIS — M25572 Pain in left ankle and joints of left foot: Secondary | ICD-10-CM | POA: Diagnosis not present

## 2019-05-04 MED ORDER — TRAMADOL HCL 50 MG PO TABS: 50.0000 mg | ORAL_TABLET | Freq: Two times a day (BID) | ORAL | 0 refills | Status: DC | PRN

## 2019-05-04 NOTE — ED Provider Notes (Signed)
[facility]    CSN: [id number] Arrival date & time: 05/04/19  1326  History   Chief Complaint Chief Complaint  Patient presents with   Ankle Pain    left   Foot Pain    left   HPI  68 year old male with an extensive past medical history including seronegative RA which is quite advanced presents with left foot and ankle pain.  Patient states that he tripped over a shoe after getting out of the bed this morning.  In doing so he injured his left ankle and foot.  His pain is mostly located on the lateral aspect of the foot.  No medications or interventions tried.  His pain is moderate in severity at 7/10 in severity.  Exacerbated by activity.  No relieving factors.  No other complaints.  Past Medical History:  Diagnosis Date   Abdominal wall mass    Anemia    Arthritis    rheumatoid arthritis of multiple sites   Asthma    BPH (benign prostatic hyperplasia)    Cataracts, both eyes    Chronic kidney disease    Clotting disorder (HCC)    Coronary artery disease    Current chronic use of systemic steroids    Depression    Disseminated zoster    Dry eyes, bilateral    Dysrhythmia    atrial fib.   Episcleritis, left    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HCC)    GERD (gastroesophageal reflux disease)    Hearing loss associated with syndrome, bilateral    Heart murmur    History of methicillin resistant staphylococcus aureus (MRSA)    Hyperlipemia    Hypertension    Leukocytoclastic vasculitis (HCC)    Myocardial infarction ([place])    Neuromuscular disorder ([place])    Peripheral vascular disease (HCC)    atherosclerosis of native artery of extremity   Pyoderma gangrenosum    Sinusitis    Sleep apnea    Thrombocytopenia (HCC)    Urticaria    Valvular disease    Past Surgical History:  Procedure Laterality Date   arthropathy     rotator cuff   CARDIAC CATHETERIZATION     COLONOSCOPY WITH PROPOFOL N/A 11/09/2016   Procedure: COLONOSCOPY WITH  PROPOFOL;  Surgeon: Manya Silvas, MD;  Location: [facility] ENDOSCOPY;  Service: Endoscopy;  Laterality: N/A;   CORONARY ARTERY BYPASS GRAFT     ESOPHAGOGASTRODUODENOSCOPY (EGD) WITH PROPOFOL N/A 11/09/2016   Procedure: ESOPHAGOGASTRODUODENOSCOPY (EGD) WITH PROPOFOL;  Surgeon: Manya Silvas, MD;  Location: [facility] ENDOSCOPY;  Service: Endoscopy;  Laterality: N/A;   Home Medications    Prior to Admission medications   Medication Sig Start Date End Date Taking? Authorizing Provider  acetaminophen (TYLENOL) 500 MG tablet Take 500 mg by mouth every 6 (six) hours as needed.   Yes [provider]  amiodarone (PACERONE) 200 MG tablet TAKE 1 TABLET BY MOUTH TWICE A DAY 02/22/19  Yes [provider]  citalopram (CELEXA) 20 MG tablet Take 20 mg by mouth daily.   Yes [provider]  diphenhydrAMINE (BENADRYL) 25 MG tablet Take 25 mg by mouth at bedtime as needed.   Yes [provider]  docusate sodium (COLACE) 100 MG capsule Take 100 mg by mouth daily.   Yes [provider]  dronabinol (MARINOL) 2.5 MG capsule Take 2.5 mg by mouth 2 (two) times daily before lunch and supper.   Yes [provider]  Ferrous Sulfate (SLOW FE PO) Take 47.5 mg by mouth daily.  Yes [provider]  finasteride (PROSCAR) 5 MG tablet Take by mouth. 04/18/19 04/17/20 Yes [provider]  guaiFENesin (ROBITUSSIN) 100 MG/5ML liquid Take 200 mg by mouth 3 (three) times daily as needed for cough.   Yes [provider]  immune globulin, human, (CARIMUNE NF) 12 g injection Inject into the vein once.   Yes [provider]  loratadine (CLARITIN) 10 MG tablet Take 10 mg by mouth daily.   Yes [provider]  losartan (COZAAR) 25 MG tablet Take 25 mg by mouth daily.   Yes [provider]  metoprolol tartrate (LOPRESSOR) 25 MG tablet Take 25 mg by mouth 2 (two) times daily.   Yes [provider]  pantoprazole (PROTONIX) 40 MG  tablet Take 40 mg by mouth daily.   Yes [provider]  predniSONE (DELTASONE) 5 MG tablet Take 10 mg by mouth daily with breakfast.    Yes [provider]  ezetimibe (ZETIA) 10 MG tablet  04/14/19   [provider]  folic acid (FOLVITE) 409 MCG tablet Take by mouth.    [provider]  furosemide (LASIX) 40 MG tablet  03/28/19   [provider]  traMADol (ULTRAM) 50 MG tablet Take 1 tablet (50 mg total) by mouth every 12 (twelve) hours as needed for moderate pain or severe pain. 05/04/19   Coral Spikes, DO  amLODipine (NORVASC) 5 MG tablet Take 5 mg by mouth daily.  05/04/19  [provider]    Family History Family History  Problem Relation Age of Onset   Parkinson's disease Father    Heart disease Father     Social History Social History   Tobacco Use   Smoking status: Never Smoker   Smokeless tobacco: Never Used  Substance Use Topics   Alcohol use: No   Drug use: No     Allergies   Crestor [rosuvastatin calcium], Remicade [infliximab], Bactrim [sulfamethoxazole-trimethoprim], Cyclobenzaprine, Cyclosporine, Dapsone, Lipitor [atorvastatin], Prednisone, Shrimp [shellfish allergy], Soma [carisoprodol], Chlorpheniramine, and Levaquin [levofloxacin in d5w]   Review of Systems Review of Systems  Constitutional: Negative.   Musculoskeletal:       Left foot and ankle pain.   Physical Exam Triage Vital Signs ED Triage Vitals  Enc Vitals Group     BP 05/04/19 1350 (!) 146/68     Pulse Rate 05/04/19 1350 (!) 49     Resp 05/04/19 1350 16     Temp 05/04/19 1350 97.9 F (36.6 C)     Temp src --      SpO2 05/04/19 1350 99 %     Weight 05/04/19 1342 157 lb (71.2 kg)     Height 05/04/19 1342 5\' 11"  (1.803 m)     Head Circumference --      Peak Flow --      Pain Score 05/04/19 1342 7     Pain Loc --      Pain Edu? --      Excl. in [place]? --    Updated Vital Signs BP (!) 146/68    Pulse (!) 49 Comment: pt states that  49-50's is his normal HR.   Temp 97.9 F (36.6 C)    Resp 16    Ht 5\' 11"  (1.803 m)    Wt 71.2 kg    SpO2 99%    BMI 21.90 kg/m   Visual Acuity Right Eye Distance:   Left Eye Distance:   Bilateral Distance:    Right Eye Near:   Left  Eye Near:    Bilateral Near:     Physical Exam Vitals signs and nursing note reviewed.  Constitutional:      General: He is not in acute distress.    Appearance: Normal appearance.  HENT:     Head: Normocephalic and atraumatic.  Eyes:     General:        Right eye: No discharge.        Left eye: No discharge.     Conjunctiva/sclera: Conjunctivae normal.  Pulmonary:     Effort: Pulmonary effort is normal. No respiratory distress.  Musculoskeletal:     Comments: Patient's hands and feet with deformities noted secondary to arthritis.  Left foot and ankle - tenderness over the base of the fifth metatarsal.  No discrete tenderness of the ankle.  Neurological:     Mental Status: He is alert.  Psychiatric:        Mood and Affect: Mood normal.        Behavior: Behavior normal.    UC Treatments / Results  Labs (all labs ordered are listed, but only abnormal results are displayed) Labs Reviewed - No data to display  EKG   Radiology Dg Ankle Complete Left  Result Date: 05/04/2019 CLINICAL DATA:  Left foot and ankle pain status post trauma. EXAM: LEFT ANKLE COMPLETE - 3+ VIEW COMPARISON:  None. FINDINGS: There is no evidence of fracture, dislocation, or joint effusion. Soft tissues are unremarkable. IMPRESSION: No acute fracture or dislocation identified. Electronically Signed   By: Abelardo Diesel M.D.   On: 05/04/2019 14:49   Dg Foot Complete Left  Result Date: 05/04/2019 CLINICAL DATA:  Acute LEFT foot pain following fall today. Initial encounter. EXAM: LEFT FOOT - COMPLETE 3+ VIEW COMPARISON:  None. FINDINGS: Dislocations of the 1st through 4th MTP joints are noted of uncertain chronicity. No discrete acute fracture noted. The Lisfranc joints  are unremarkable. No suspicious focal bony lesions are present. Diffuse osteopenia noted. IMPRESSION: Age indeterminate dislocations of the 1st through 4th MTP joints. No discrete acute fracture. Osteopenia Electronically Signed   By: Margarette Canada M.D.   On: 05/04/2019 14:50    Procedures Procedures (including critical care time)  Medications Ordered in UC Medications - No data to display  Initial Impression / Assessment and Plan / UC Course  I have reviewed the triage vital signs and the nursing notes.  Pertinent labs & imaging results that were available during my care of the patient were reviewed by me and considered in my medical decision making (see chart for details).    68 year old male presents following an injury at home.  X-ray with no evident acute fracture.  He does have advanced rheumatoid arthritis and associated dislocations of the first through fourth MTP joints.  Tramadol as needed.  Advised rest, ice, elevation. Cam Walker to help with ambulation.  Final Clinical Impressions(s) / UC Diagnoses   Final diagnoses:  Foot pain, left  Acute left ankle pain     Discharge Instructions     Rest, elevation.  Pain medication as directed.  Take care  Dr. Lacinda Axon     ED Prescriptions    Medication Sig Dispense Auth. Provider   traMADol (ULTRAM) 50 MG tablet Take 1 tablet (50 mg total) by mouth every 12 (twelve) hours as needed for moderate pain or severe pain. 10 tablet Coral Spikes, DO     Controlled Substance Prescriptions [place] Controlled Substance Registry consulted? Yes, I have consulted the [place] Controlled Substances Registry for  this patient, and feel the risk/benefit ratio today is favorable for proceeding with this prescription for a controlled substance.   Coral Spikes, [place] 05/04/19 1501

## 2019-05-04 NOTE — Discharge Instructions (Signed)
Rest, elevation.  Pain medication as directed.  Take care  Dr. Lacinda Axon

## 2019-05-04 NOTE — ED Triage Notes (Signed)
Pt c/o foot and ankle pain. He states that he he turned his ankle this morning when getting out of bed. He tripped over a shoe and feel onto the bed. He states he heard it "tearing"

## 2020-01-29 DIAGNOSIS — M8000XD Age-related osteoporosis with current pathological fracture, unspecified site, subsequent encounter for fracture with routine healing: Secondary | ICD-10-CM | POA: Insufficient documentation

## 2020-08-26 DIAGNOSIS — N261 Atrophy of kidney (terminal): Secondary | ICD-10-CM | POA: Insufficient documentation

## 2020-08-30 DIAGNOSIS — C480 Malignant neoplasm of retroperitoneum: Secondary | ICD-10-CM | POA: Insufficient documentation

## 2020-10-05 DIAGNOSIS — I2510 Atherosclerotic heart disease of native coronary artery without angina pectoris: Secondary | ICD-10-CM | POA: Insufficient documentation

## 2020-10-05 DIAGNOSIS — C9110 Chronic lymphocytic leukemia of B-cell type not having achieved remission: Secondary | ICD-10-CM | POA: Insufficient documentation

## 2020-10-05 DIAGNOSIS — I5022 Chronic systolic (congestive) heart failure: Secondary | ICD-10-CM | POA: Insufficient documentation

## 2021-04-21 DIAGNOSIS — L88 Pyoderma gangrenosum: Secondary | ICD-10-CM | POA: Insufficient documentation

## 2021-04-21 DIAGNOSIS — D849 Immunodeficiency, unspecified: Secondary | ICD-10-CM | POA: Insufficient documentation

## 2021-09-25 ENCOUNTER — Ambulatory Visit
Admission: EM | Admit: 2021-09-25 | Discharge: 2021-09-25 | Disposition: A | Discharge status: Home / Self Care | Payer: Medicare Other | Attending: Emergency Medicine | Admitting: Emergency Medicine

## 2021-09-25 ENCOUNTER — Other Ambulatory Visit: Payer: Self-pay

## 2021-09-25 ENCOUNTER — Ambulatory Visit (INDEPENDENT_AMBULATORY_CARE_PROVIDER_SITE_OTHER): Payer: Medicare Other

## 2021-09-25 DIAGNOSIS — R519 Headache, unspecified: Secondary | ICD-10-CM | POA: Insufficient documentation

## 2021-09-25 DIAGNOSIS — K219 Gastro-esophageal reflux disease without esophagitis: Secondary | ICD-10-CM | POA: Diagnosis not present

## 2021-09-25 DIAGNOSIS — Z952 Presence of prosthetic heart valve: Secondary | ICD-10-CM | POA: Diagnosis not present

## 2021-09-25 DIAGNOSIS — Z79631 Long term (current) use of antimetabolite agent: Secondary | ICD-10-CM | POA: Diagnosis not present

## 2021-09-25 DIAGNOSIS — J101 Influenza due to other identified influenza virus with other respiratory manifestations: Secondary | ICD-10-CM | POA: Diagnosis not present

## 2021-09-25 DIAGNOSIS — M069 Rheumatoid arthritis, unspecified: Secondary | ICD-10-CM | POA: Insufficient documentation

## 2021-09-25 DIAGNOSIS — I4891 Unspecified atrial fibrillation: Secondary | ICD-10-CM | POA: Insufficient documentation

## 2021-09-25 DIAGNOSIS — R040 Epistaxis: Secondary | ICD-10-CM | POA: Insufficient documentation

## 2021-09-25 DIAGNOSIS — D84821 Immunodeficiency due to drugs: Secondary | ICD-10-CM | POA: Insufficient documentation

## 2021-09-25 DIAGNOSIS — N189 Chronic kidney disease, unspecified: Secondary | ICD-10-CM | POA: Insufficient documentation

## 2021-09-25 DIAGNOSIS — Z8614 Personal history of Methicillin resistant Staphylococcus aureus infection: Secondary | ICD-10-CM | POA: Insufficient documentation

## 2021-09-25 DIAGNOSIS — Z951 Presence of aortocoronary bypass graft: Secondary | ICD-10-CM | POA: Diagnosis not present

## 2021-09-25 DIAGNOSIS — J45909 Unspecified asthma, uncomplicated: Secondary | ICD-10-CM | POA: Insufficient documentation

## 2021-09-25 DIAGNOSIS — E7800 Pure hypercholesterolemia, unspecified: Secondary | ICD-10-CM | POA: Diagnosis not present

## 2021-09-25 DIAGNOSIS — Z20822 Contact with and (suspected) exposure to covid-19: Secondary | ICD-10-CM | POA: Insufficient documentation

## 2021-09-25 DIAGNOSIS — Z9889 Other specified postprocedural states: Secondary | ICD-10-CM | POA: Diagnosis not present

## 2021-09-25 DIAGNOSIS — Z7952 Long term (current) use of systemic steroids: Secondary | ICD-10-CM | POA: Insufficient documentation

## 2021-09-25 DIAGNOSIS — R197 Diarrhea, unspecified: Secondary | ICD-10-CM | POA: Insufficient documentation

## 2021-09-25 DIAGNOSIS — R059 Cough, unspecified: Secondary | ICD-10-CM

## 2021-09-25 DIAGNOSIS — C8590 Non-Hodgkin lymphoma, unspecified, unspecified site: Secondary | ICD-10-CM | POA: Insufficient documentation

## 2021-09-25 DIAGNOSIS — I129 Hypertensive chronic kidney disease with stage 1 through stage 4 chronic kidney disease, or unspecified chronic kidney disease: Secondary | ICD-10-CM | POA: Diagnosis not present

## 2021-09-25 DIAGNOSIS — I252 Old myocardial infarction: Secondary | ICD-10-CM | POA: Insufficient documentation

## 2021-09-25 LAB — RESP PANEL BY RT-PCR (FLU A&B, COVID) ARPGX2
Influenza A by PCR: POSITIVE — AB
Influenza B by PCR: NEGATIVE
SARS Coronavirus 2 by RT PCR: NEGATIVE

## 2021-09-25 LAB — GROUP A STREP BY PCR: Group A Strep by PCR: NOT DETECTED

## 2021-09-25 MED ORDER — OSELTAMIVIR PHOSPHATE 6 MG/ML PO SUSR: ORAL | 0 refills | Status: AC

## 2021-09-25 NOTE — ED Triage Notes (Signed)
Pt here with C/O cough, body aches, loose stools, fever. Pt is on Chemo and infusions and was advised to come and get tested for Coivd and flu.

## 2021-09-25 NOTE — Discharge Instructions (Addendum)
Saline nasal irrigation with a NeilMed sinus rinse and distilled water as often as you want.  Finish the Tamiflu, even if you feel better.  5 mL of children's liquid Benadryl mixed with 5 mL of Maalox mixed together, gargle and swallow as needed for tongue and throat soreness.  I decided to hold off on the Flonase for now.

## 2021-09-25 NOTE — ED Provider Notes (Signed)
HPI  SUBJECTIVE:  Benjamin Richards is a 70 y.o. male who presents with 3 days of headaches, nasal congestion, green rhinorrhea, postnasal drip, upper dental pain,  sore throat, cough, burning sinus/throat pain, "blisters" on his tongue, and intermittent epistaxis.  He reports fevers T-max 99.3, states that his baseline is 97.  No body aches, sinus pain or pressure, facial swelling, wheezing, shortness of breath, dyspnea on exertion, hemoptysis, nausea, vomiting.  He reports some diarrhea, mild abdominal pain and "lots of gas".  No known strep, flu exposure.  He was exposed to COVID 1 week ago.  He got this years flu vaccine and has had 5 COVID vaccines.  No antibiotics in the past 3 months.  He took Tylenol within 6 hours of evaluation.  He has been taking Tylenol 325 mg 3 times daily, Marinol with improvement in his symptoms.  Symptoms are worse with cold air. Patient has an extensive past medical history including lymphoma, currently being treated with methotrexate/ chemotherapy infusions, rheumatoid arthritis, currently on prednisone, MI/CABG, MRSA, atrial fibrillation status post ablation, chronic kidney disease, hypertension, asthma, AAA status post repair, disseminated zoster, murmur, hypercholesterolemia, GERD, and is status post tonsillectomy.  PMD: Idelle Crouch, MD   Past Medical History:  Diagnosis Date   Abdominal wall mass    Anemia    Arthritis    rheumatoid arthritis of multiple sites   Asthma    BPH (benign prostatic hyperplasia)    Cataracts, both eyes    Chronic kidney disease    Clotting disorder (HCC)    Coronary artery disease    Current chronic use of systemic steroids    Depression    Disseminated zoster    Dry eyes, bilateral    Dysrhythmia    atrial fib.   Episcleritis, left    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HCC)    GERD (gastroesophageal reflux disease)    Hearing loss associated with syndrome, bilateral    Heart murmur    History of methicillin resistant staphylococcus  aureus (MRSA)    Hyperlipemia    Hypertension    Leukocytoclastic vasculitis (HCC)    Myocardial infarction ([place])    Neuromuscular disorder ([place])    Peripheral vascular disease (HCC)    atherosclerosis of native artery of extremity   Pyoderma gangrenosum    Sinusitis    Sleep apnea    Thrombocytopenia (HCC)    Urticaria    Valvular disease     Past Surgical History:  Procedure Laterality Date   arthropathy     rotator cuff   CARDIAC CATHETERIZATION     COLONOSCOPY WITH PROPOFOL N/A 11/09/2016   Procedure: COLONOSCOPY WITH PROPOFOL;  Surgeon: Manya Silvas, MD;  Location: [facility] ENDOSCOPY;  Service: Endoscopy;  Laterality: N/A;   CORONARY ARTERY BYPASS GRAFT     ESOPHAGOGASTRODUODENOSCOPY (EGD) WITH PROPOFOL N/A 11/09/2016   Procedure: ESOPHAGOGASTRODUODENOSCOPY (EGD) WITH PROPOFOL;  Surgeon: Manya Silvas, MD;  Location: [facility] ENDOSCOPY;  Service: Endoscopy;  Laterality: N/A;    Family History  Problem Relation Age of Onset   Parkinson's disease Father    Heart disease Father     Social History   Tobacco Use   Smoking status: Never   Smokeless tobacco: Never  Vaping Use   Vaping Use: Never used  Substance Use Topics   Alcohol use: No   Drug use: No    No current facility-administered medications for this encounter.  Current Outpatient Medications:    acetaminophen (TYLENOL) 500 MG tablet, Take 500 mg by  mouth every 6 (six) hours as needed., Disp: , Rfl:    amiodarone (PACERONE) 200 MG tablet, TAKE 1 TABLET BY MOUTH TWICE A DAY, Disp: , Rfl:    Cholecalciferol 125 MCG (5000 UT) TABS, Take by mouth., Disp: , Rfl:    citalopram (CELEXA) 20 MG tablet, Take 20 mg by mouth daily., Disp: , Rfl:    cyanocobalamin 1000 MCG tablet, Take by mouth., Disp: , Rfl:    diphenhydrAMINE (BENADRYL) 25 MG tablet, Take 25 mg by mouth at bedtime as needed., Disp: , Rfl:    diphenhydrAMINE (SOMINEX) 25 MG tablet, 0.5-1 tablet BEDTIME (route: oral), Disp: , Rfl:    docusate sodium  (COLACE) 100 MG capsule, Take 100 mg by mouth daily., Disp: , Rfl:    dronabinol (MARINOL) 2.5 MG capsule, Take 2.5 mg by mouth 2 (two) times daily before lunch and supper., Disp: , Rfl:    empagliflozin (JARDIANCE) 10 MG TABS tablet, Take 1 tablet by mouth daily., Disp: , Rfl:    eplerenone (INSPRA) 25 MG tablet, Take 12.5 mg by mouth daily., Disp: , Rfl:    ezetimibe (ZETIA) 10 MG tablet, , Disp: , Rfl:    Ferrous Sulfate (SLOW FE PO), Take 47.5 mg by mouth daily., Disp: , Rfl:    finasteride (PROSCAR) 5 MG tablet, Take by mouth., Disp: , Rfl:    folic acid (FOLVITE) 250 MCG tablet, Take by mouth., Disp: , Rfl:    furosemide (LASIX) 40 MG tablet, , Disp: , Rfl:    guaiFENesin (ROBITUSSIN) 100 MG/5ML liquid, Take 200 mg by mouth 3 (three) times daily as needed for cough., Disp: , Rfl:    immune globulin, human, (CARIMUNE NF) 12 g injection, Inject into the vein once., Disp: , Rfl:    loratadine (CLARITIN) 10 MG tablet, Take 10 mg by mouth daily., Disp: , Rfl:    losartan (COZAAR) 25 MG tablet, Take 25 mg by mouth daily., Disp: , Rfl:    losartan (COZAAR) 25 MG tablet, Take 1 tablet by mouth daily., Disp: , Rfl:    methotrexate (RHEUMATREX) 2.5 MG tablet, Take by mouth., Disp: , Rfl:    metoprolol succinate (TOPROL-XL) 25 MG 24 hr tablet, Take 12.5 mg by mouth daily., Disp: , Rfl:    metoprolol tartrate (LOPRESSOR) 25 MG tablet, Take 25 mg by mouth 2 (two) times daily., Disp: , Rfl:    nitroGLYCERIN (NITROSTAT) 0.4 MG SL tablet, Place under the tongue., Disp: , Rfl:    oseltamivir (TAMIFLU) 6 MG/ML SUSR suspension, 75 mg for the first dose, then 30 mg twice a day for 5 days, Disp: 66 mL, Rfl: 0   pantoprazole (PROTONIX) 40 MG tablet, Take 40 mg by mouth daily., Disp: , Rfl:    potassium chloride (MICRO-K) 10 MEQ CR capsule, Take by mouth., Disp: , Rfl:    predniSONE (DELTASONE) 5 MG tablet, Take 10 mg by mouth daily with breakfast. , Disp: , Rfl:    urea (CARMOL) 40 % CREA, Apply to feet daily  for dry, thick, dark skin., Disp: , Rfl:   Allergies  Allergen Reactions   Crestor [Rosuvastatin Calcium] Other (See Comments)   Remicade [Infliximab] Anaphylaxis   Bactrim [Sulfamethoxazole-Trimethoprim] Hives   Cyclobenzaprine Other (See Comments)    Dry mouth   Cyclosporine     "caused system to shut down'   Dapsone Other (See Comments)   Lipitor [Atorvastatin] Other (See Comments)    Muscle deteriorates   Prednisone Other (See Comments)    High  doses causes severe pain, insomnia, change in mental status, crashes immune system   Shrimp [Shellfish Allergy] Other (See Comments)    Swelling of lips   Soma [Carisoprodol] Other (See Comments)    hallucinations   Chlorpheniramine Palpitations   Levaquin [Levofloxacin In D5w] Rash     ROS  As noted in HPI.   Physical Exam  BP 115/62 (BP Location: Left Arm)    Pulse 73    Temp 99.3 F (37.4 C) (Oral)    Resp 18    Ht 5\' 9"  (1.753 m)    Wt 70.3 kg    SpO2 96%    BMI 22.89 kg/m   Constitutional: Well developed, well nourished, no acute distress Eyes:  EOMI, conjunctiva normal bilaterally HENT: Normocephalic, atraumatic,mucus membranes moist.  No nasal congestion.  No maxillary, frontal sinus tenderness.  Erythematous oropharynx.  Uvula appears midline.  Tonsils absent.  Positive ulcers on tongue. Neck: No cervical lymphadenopathy Respiratory: Normal inspiratory effort, lungs clear bilaterally Cardiovascular: Normal rate, regular rhythm, positive loud murmur GI: nondistended skin: No rash, skin intact Musculoskeletal: no deformities Neurologic: Alert & oriented x 3, no focal neuro deficits Psychiatric: Speech and behavior appropriate   ED Course   Medications - No data to display  Orders Placed This Encounter  Procedures   Resp Panel by RT-PCR (Flu A&B, Covid) Nasopharyngeal Swab    Standing Status:   Standing    Number of Occurrences:   1   Group A Strep by PCR    Standing Status:   Standing    Number of  Occurrences:   1   DG Chest 2 View    Standing Status:   Standing    Number of Occurrences:   1    Order Specific Question:   Reason for Exam (SYMPTOM  OR DIAGNOSIS REQUIRED)    Answer:   r/o PNA    Results for orders placed or performed during the hospital encounter of 09/25/21 (from the past 24 hour(s))  Resp Panel by RT-PCR (Flu A&B, Covid) Nasopharyngeal Swab     Status: Abnormal   Collection Time: 09/25/21  1:55 PM   Specimen: Nasopharyngeal Swab; Nasopharyngeal(NP) swabs in vial transport medium  Result Value Ref Range   SARS Coronavirus 2 by RT PCR NEGATIVE NEGATIVE   Influenza A by PCR POSITIVE (A) NEGATIVE   Influenza B by PCR NEGATIVE NEGATIVE  Group A Strep by PCR     Status: None   Collection Time: 09/25/21  2:30 PM   Specimen: Throat; Sterile Swab  Result Value Ref Range   Group A Strep by PCR NOT DETECTED NOT DETECTED   DG Chest 2 View  Result Date: 09/25/2021 CLINICAL DATA:  Cough, body aches EXAM: CHEST - 2 VIEW COMPARISON:  02/14/2010 FINDINGS: No focal consolidation. No pleural effusion or pneumothorax. Heart and mediastinal contours are unremarkable. Prior CABG and valve replacement. No acute osseous abnormality. IMPRESSION: No active cardiopulmonary disease. Electronically Signed   By: Kathreen Devoid M.D.   On: 09/25/2021 15:02    ED Clinical Impression  1. Influenza A      ED Assessment/Plan  Checking COVID, flu, strep and a chest x-ray because patient is immunocompromised.  Reviewed imaging independently.  No pneumonia.  See radiology report for full details.  COVID, strep PCR negative.  X-ray normal. Influenza A positive.  Calculated creatinine clearance 49 mL/min.  Will renally dose Tamiflu at 75 mg  1 dose, then 30 mg twice daily.  May take Tylenol, advised  saline nasal irrigation, Benadryl/Maalox mixture if the sores on his tongue start to hurt.  Follow-up with PMD as needed.  Strict ER return precautions given.  Discussed labs, imaging, MDM, treatment  plan, and plan for follow-up with patient. Discussed sn/sx that should prompt return to the ED. patient agrees with plan.   Meds ordered this encounter  Medications   oseltamivir (TAMIFLU) 6 MG/ML SUSR suspension    Sig: 75 mg for the first dose, then 30 mg twice a day for 5 days    Dispense:  66 mL    Refill:  0      *This clinic note was created using [employer]. Therefore, there may be occasional mistakes despite careful proofreading.  ?    Melynda Ripple, MD 09/26/21 [phone]

## 2022-03-21 IMAGING — CR DG CHEST 2V
4 series · 4 of 4 positions shown · non-contrast
Comparison: 02/14/2010

CLINICAL DATA: Cough, body aches

EXAM:
CHEST - 2 VIEW

[chest lat (1 of 2)]
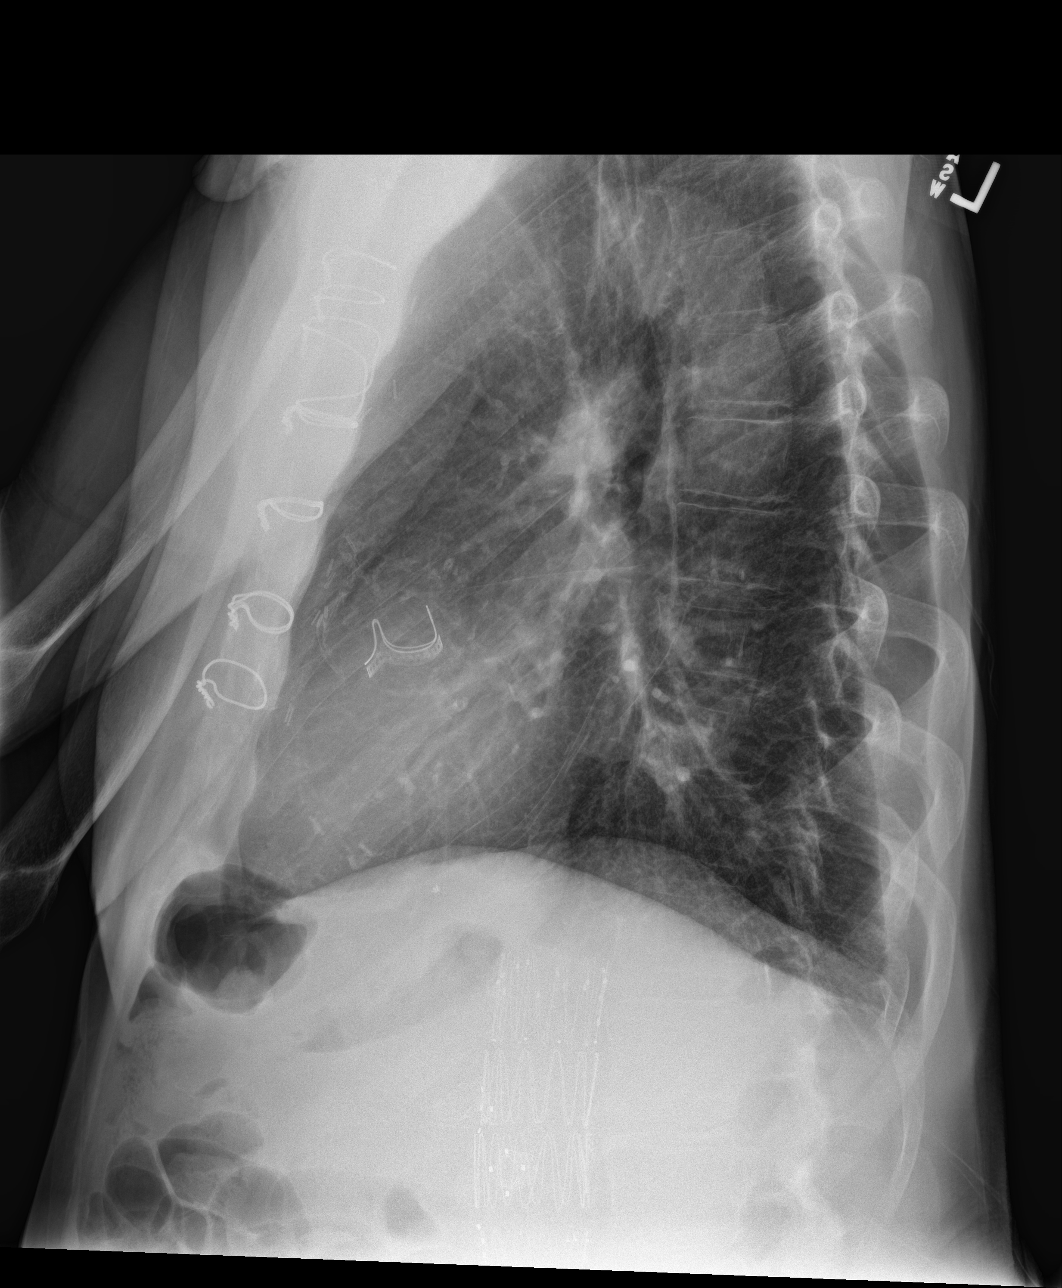

[chest pa (1 of 2)]
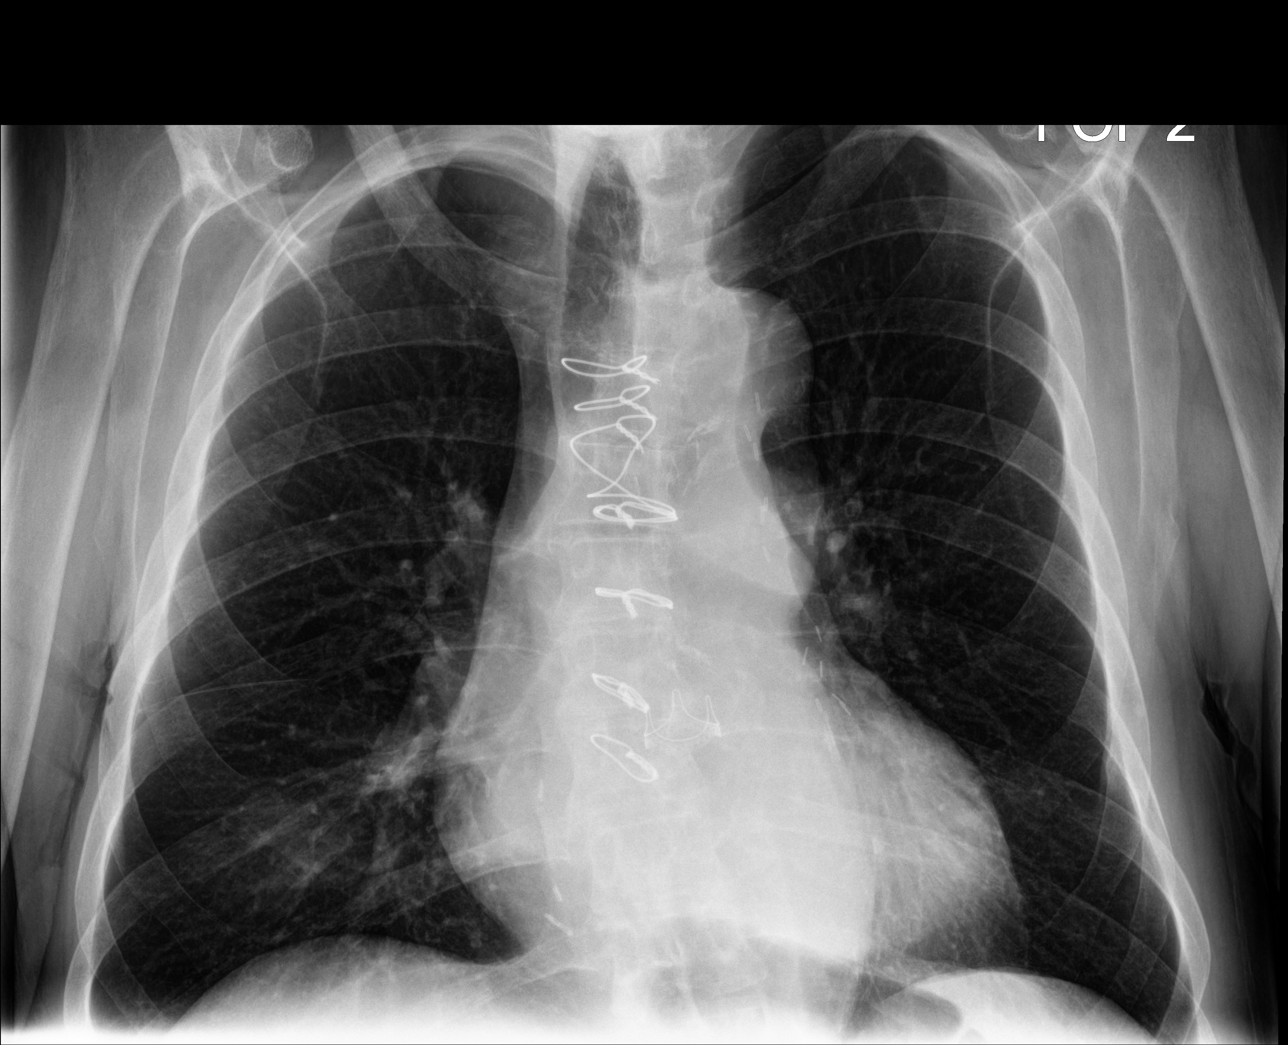

[chest pa (2 of 2)]
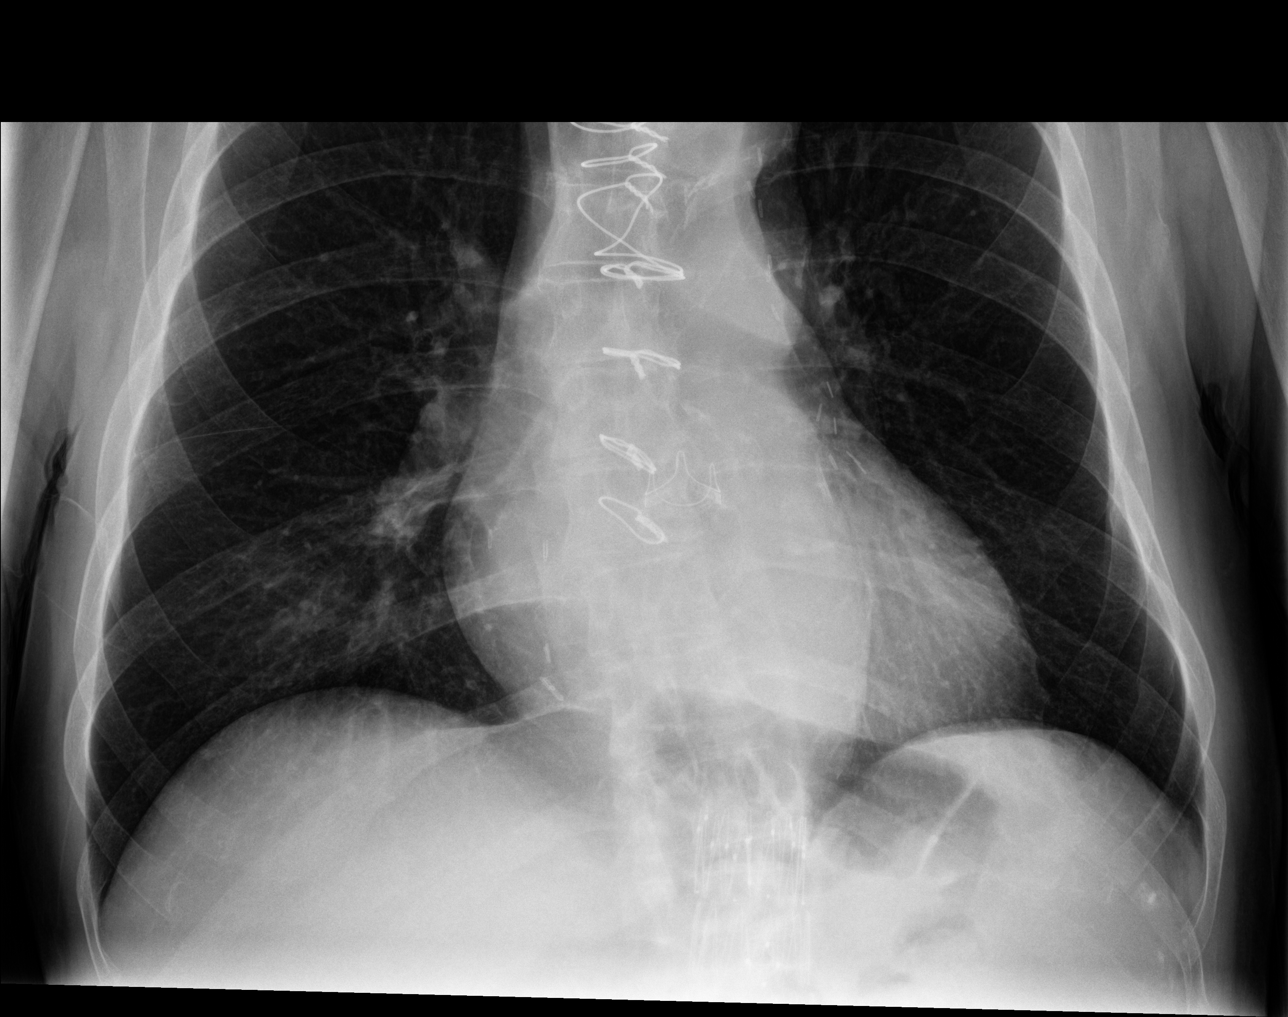

[chest lat (2 of 2)]
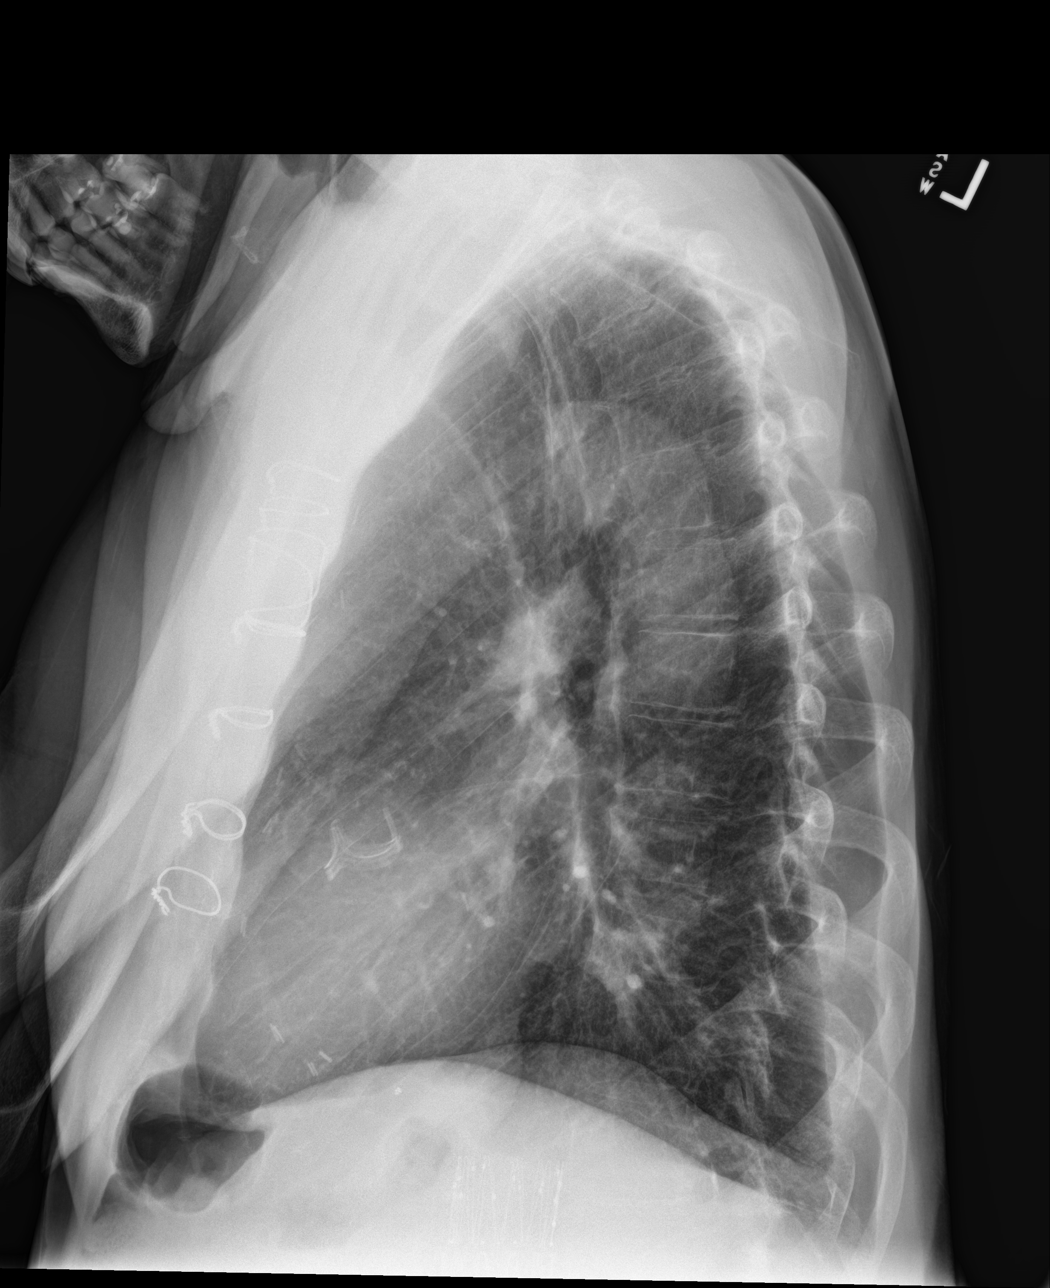

[4 of 4 positions shown; findings below may reference images not displayed]

FINDINGS: No focal consolidation. No pleural effusion or pneumothorax. Heart
and mediastinal contours are unremarkable. Prior CABG and valve
replacement.

No acute osseous abnormality.
IMPRESSION: No active cardiopulmonary disease.

## 2022-09-19 ENCOUNTER — Ambulatory Visit (INDEPENDENT_AMBULATORY_CARE_PROVIDER_SITE_OTHER): Payer: Medicare Other

## 2022-09-19 ENCOUNTER — Ambulatory Visit
Admission: EM | Admit: 2022-09-19 | Discharge: 2022-09-19 | Disposition: A | Discharge status: Home / Self Care | Payer: Medicare Other | Attending: Nurse Practitioner | Admitting: Nurse Practitioner

## 2022-09-19 DIAGNOSIS — W19XXXA Unspecified fall, initial encounter: Secondary | ICD-10-CM

## 2022-09-19 DIAGNOSIS — M545 Low back pain, unspecified: Secondary | ICD-10-CM

## 2022-09-19 DIAGNOSIS — S51012A Laceration without foreign body of left elbow, initial encounter: Secondary | ICD-10-CM

## 2022-09-19 DIAGNOSIS — S32010A Wedge compression fracture of first lumbar vertebra, initial encounter for closed fracture: Secondary | ICD-10-CM

## 2022-09-19 MED ORDER — MUPIROCIN 2 % EX OINT: 1.0000 | TOPICAL_OINTMENT | Freq: Two times a day (BID) | CUTANEOUS | 0 refills | Status: AC

## 2022-09-19 MED ORDER — IBUPROFEN 400 MG PO TABS
400.0000 mg | ORAL_TABLET | Freq: Once | ORAL | Status: AC
  Administered 2022-09-19: 400 mg via ORAL

## 2022-09-19 NOTE — Discharge Instructions (Addendum)
Keep wound clean and dry.  Apply mupirocin ointment twice daily for 7 days Heat to low back as needed Rest and continue ibuprofen as needed If you are unable to get in with your orthopedist at [facility] please go to [facility] for further evaluation and treatment options Follow-up with PCP 2 to 3 days for recheck Please go to the emergency room if you have any worsening symptoms.  This includes but is not limited to uncontrolled pain, numbness/tingling/weakness of your lower extremities, bowel or bladder incontinence, or numbness in your groin.

## 2022-09-19 NOTE — ED Triage Notes (Signed)
Patient presents to UC for left elbow pain, back pain, left elbow abrasion since yesterday evening. Assessed by EMS. Instructed to follow-up if no better. Treating pain with ibuprofen, prednisone, miranol, and tylenol.

## 2022-09-19 NOTE — ED Provider Notes (Signed)
[facility]    CSN: [id number] Arrival date & time: 09/19/22  1204      History   Chief Complaint Chief Complaint  Patient presents with   Arm Injury   Back Pain   Abrasion    HPI Benjamin Richards is a 71 y.o. male presents for evaluation after a fall.  Patient states yesterday while opening a box he accidentally fell landing onto his left side.  Denies head injury or LOC.  He sustained a skin tear to his left elbow and has since had left lower back pain that has been constant and does not radiate.  Denies any numbness/tingling/weakness of his lower extremities, bowel or bladder incontinence, saddle paresthesia.  Reports a history of herniated disc in his low back.  He has been taking ibuprofen and states his pain has improved.  He was evaluated by EMS at his home after the fall and was discouraged from going to the ER due to the wait times.  He wants to be sure that he did not hurt his back further.  He otherwise denies left hip, left leg, or neck pain.    Arm Injury Associated symptoms: back pain   Back Pain   Past Medical History:  Diagnosis Date   Abdominal wall mass    Anemia    Arthritis    rheumatoid arthritis of multiple sites   Asthma    BPH (benign prostatic hyperplasia)    Cataracts, both eyes    Chronic kidney disease    Clotting disorder (HCC)    Coronary artery disease    Current chronic use of systemic steroids    Depression    Disseminated zoster    Dry eyes, bilateral    Dysrhythmia    atrial fib.   Episcleritis, left    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HCC)    GERD (gastroesophageal reflux disease)    Hearing loss associated with syndrome, bilateral    Heart murmur    History of methicillin resistant staphylococcus aureus (MRSA)    Hyperlipemia    Hypertension    Leukocytoclastic vasculitis (HCC)    Myocardial infarction ([place])    Neuromuscular disorder ([place])    Peripheral vascular disease (HCC)    atherosclerosis of native artery of extremity    Pyoderma gangrenosum    Sinusitis    Sleep apnea    Thrombocytopenia (HCC)    Urticaria    Valvular disease     Patient Active Problem List   Diagnosis Date Noted   Immunosuppressed status ([place]) 04/21/2021   Pyoderma gangrenosum 04/21/2021   Athscl heart disease of native coronary artery w/o ang pctrs 10/05/2020   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[place]) 10/62/6948   Chronic systolic (congestive) heart failure ([place]) 10/05/2020   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   Atrophy of left kidney 08/26/2020   Age-related osteoporosis with current pathological fracture with routine healing 01/29/2020   Elevated PSA 04/28/2019   BPH (benign prostatic hyperplasia) 01/17/2019   Stage 3a chronic kidney disease ([place]) 01/14/2019   Vasculopathy 05/20/2018   Pancytopenia ([place]) 05/17/2018   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[place]) 02/22/2018   Chronic pain of multiple joints 12/21/2017   Bunion of great toe 11/30/2017   Long-term use of immunosuppressant medication 11/30/2017   Cutaneous vasculitis 09/07/2017   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   Red blood cell antibody positive 03/23/2016   Current chronic use of systemic steroids 03/03/2016   High risk medication use  06/18/2015   Thrombocytopenia ([place]) 12/17/2014   Retroperitoneal mass 11/17/2013   Atherosclerosis of native artery of extremity with intermittent claudication ([place]) 02/02/2013   Atrial fibrillation ([place]) 11/09/2011   Coronary artery disease involving coronary bypass graft of native heart 11/09/2011   Hyperlipidemia 10/13/2011   Essential hypertension 08/10/2011   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[place]) 08/10/2011   History of heart valve replacement with bioprosthetic valve 08/10/2011   Rheumatoid arthritis of multiple sites with negative rheumatoid factor ([place]) 08/10/2011   Presence of aortocoronary bypass graft 10/05/2009   Presence of prosthetic heart valve  10/05/2009    Past Surgical History:  Procedure Laterality Date   arthropathy     rotator cuff   CARDIAC CATHETERIZATION     COLONOSCOPY WITH PROPOFOL N/A 11/09/2016   Procedure: COLONOSCOPY WITH PROPOFOL;  Surgeon: Manya Silvas, MD;  Location: [place] [facility];  Service: Endoscopy;  Laterality: N/A;   CORONARY ARTERY BYPASS GRAFT     ESOPHAGOGASTRODUODENOSCOPY (EGD) WITH PROPOFOL N/A 11/09/2016   Procedure: ESOPHAGOGASTRODUODENOSCOPY (EGD) WITH PROPOFOL;  Surgeon: Manya Silvas, MD;  Location: [facility] ENDOSCOPY;  Service: Endoscopy;  Laterality: N/A;       Home Medications    Prior to Admission medications   Medication Sig Start Date End Date Taking? Authorizing Provider  mupirocin ointment (BACTROBAN) 2 % Apply 1 Application topically 2 (two) times daily for 7 days. 09/19/22 09/26/22 Yes Melynda Ripple, NP  acetaminophen (TYLENOL) 500 MG tablet Take 500 mg by mouth every 6 (six) hours as needed.    [provider]  amiodarone (PACERONE) 200 MG tablet TAKE 1 TABLET BY MOUTH TWICE A DAY 02/22/19   [provider]  Cholecalciferol 125 MCG (5000 UT) TABS Take by mouth. 02/17/19   [provider]  citalopram (CELEXA) 20 MG tablet Take 20 mg by mouth daily.    [provider]  cyanocobalamin 1000 MCG tablet Take by mouth.    [provider]  diphenhydrAMINE (BENADRYL) 25 MG tablet Take 25 mg by mouth at bedtime as needed.    [provider]  diphenhydrAMINE (SOMINEX) 25 MG tablet 0.5-1 tablet BEDTIME (route: oral) 10/05/20   [provider]  docusate sodium (COLACE) 100 MG capsule Take 100 mg by mouth daily.    [provider]  dronabinol (MARINOL) 2.5 MG capsule Take 2.5 mg by mouth 2 (two) times daily before lunch and supper.    [provider]  eplerenone (INSPRA) 25 MG tablet Take 12.5 mg by mouth daily. 09/18/21   [provider]  ezetimibe (ZETIA) 10 MG tablet  04/14/19   [provider]   Ferrous Sulfate (SLOW FE PO) Take 47.5 mg by mouth daily.    [provider]  finasteride (PROSCAR) 5 MG tablet Take by mouth. 04/11/21   [provider]  folic acid (FOLVITE) 892 MCG tablet Take by mouth.    [provider]  furosemide (LASIX) 40 MG tablet  03/28/19   [provider]  guaiFENesin (ROBITUSSIN) 100 MG/5ML liquid Take 200 mg by mouth 3 (three) times daily as needed for cough.    [provider]  immune globulin, human, (CARIMUNE NF) 12 g injection Inject into the vein once.    [provider]  loratadine (CLARITIN) 10 MG tablet Take 10 mg by mouth daily.    [provider]  losartan (COZAAR) 25 MG tablet Take 25 mg by mouth daily.    [provider]  losartan (COZAAR) 25 MG tablet Take 1 tablet by  mouth daily. 01/24/21   [provider]  metoprolol succinate (TOPROL-XL) 25 MG 24 hr tablet Take 12.5 mg by mouth daily. 09/18/21   [provider]  metoprolol tartrate (LOPRESSOR) 25 MG tablet Take 25 mg by mouth 2 (two) times daily.    [provider]  nitroGLYCERIN (NITROSTAT) 0.4 MG SL tablet Place under the tongue. 06/20/21   [provider]  oseltamivir (TAMIFLU) 6 MG/ML SUSR suspension 75 mg for the first dose, then 30 mg twice a day for 5 days 09/25/21   Melynda Ripple, MD  pantoprazole (PROTONIX) 40 MG tablet Take 40 mg by mouth daily.    [provider]  potassium chloride (MICRO-K) 10 MEQ CR capsule Take by mouth. 09/23/21   [provider]  predniSONE (DELTASONE) 5 MG tablet Take 10 mg by mouth daily with breakfast.     [provider]  urea (CARMOL) 40 % CREA Apply to feet daily for dry, thick, dark skin. 08/21/21   [provider]  amLODipine (NORVASC) 5 MG tablet Take 5 mg by mouth daily.  05/04/19  [provider]    Family History Family History  Problem Relation Age of Onset   Parkinson's disease Father    Heart  disease Father     Social History Social History   Tobacco Use   Smoking status: Never   Smokeless tobacco: Never  Vaping Use   Vaping Use: Never used  Substance Use Topics   Alcohol use: No   Drug use: No     Allergies   Crestor [rosuvastatin calcium], Remicade [infliximab], Rituximab, Bactrim [sulfamethoxazole-trimethoprim], Cyclobenzaprine, Cyclosporine, Dapsone, Lipitor [atorvastatin], Prednisone, Shrimp extract allergy skin test, Shrimp [shellfish allergy], Soma [carisoprodol], Sucralfate, Zoledronic acid, Chlorpheniramine, and Levaquin [levofloxacin in d5w]   Review of Systems Review of Systems  Musculoskeletal:  Positive for back pain.  Skin:  Positive for wound.     Physical Exam Triage Vital Signs ED Triage Vitals  Enc Vitals Group     BP 09/19/22 1346 125/60     Pulse Rate 09/19/22 1346 63     Resp 09/19/22 1346 16     Temp 09/19/22 1346 98.4 F (36.9 C)     Temp Source 09/19/22 1346 Oral     SpO2 09/19/22 1346 98 %     Weight --      Height --      Head Circumference --      Peak Flow --      Pain Score 09/19/22 1342 6     Pain Loc --      Pain Edu? --      Excl. in [place]? --    No data found.  Updated Vital Signs BP 125/60 (BP Location: Right Arm)   Pulse 63   Temp 98.4 F (36.9 C) (Oral)   Resp 16   SpO2 98%   Visual Acuity Right Eye Distance:   Left Eye Distance:   Bilateral Distance:    Right Eye Near:   Left Eye Near:    Bilateral Near:     Physical Exam Vitals and nursing note reviewed.  Constitutional:      Appearance: Normal appearance.  HENT:     Head: Normocephalic and atraumatic.  Eyes:     Pupils: Pupils are equal, round, and reactive to light.  Cardiovascular:     Rate and Rhythm: Normal rate.  Pulmonary:     Effort: Pulmonary effort is normal.  Musculoskeletal:       Back:  Comments: Strength 5/5 BLE  Skin:    General: Skin is warm and dry.       Neurological:     General: No focal deficit present.      Mental Status: He is alert and oriented to person, place, and time.  Psychiatric:        Mood and Affect: Mood normal.        Behavior: Behavior normal.      UC Treatments / Results  Labs (all labs ordered are listed, but only abnormal results are displayed) Labs Reviewed - No data to display  EKG   Radiology DG Lumbar Spine Complete  Result Date: 09/19/2022 CLINICAL DATA:  Fall, back pain EXAM: LUMBAR SPINE - COMPLETE 4+ VIEW COMPARISON:  None Available. FINDINGS: Moderate superior endplate compression fracture of the L1 vertebral body with approximately 50% vertebral body height loss. Fracture appears acute. Advanced degenerative disc disease at L4-5 and L5-S1. Complete collapse of the L5-S1 disc space which appears chronic. Grade 1 anterolisthesis of L4 on L5. Overlying aortoiliac stent graft. IMPRESSION: 1. Moderate acute superior endplate compression fracture of the L1 vertebral body. 2. Advanced degenerative disc disease at L4-5 and L5-S1. Electronically Signed   By: Davina Poke D.O.   On: 09/19/2022 15:06    Procedures Procedures (including critical care time)  Medications Ordered in UC Medications  ibuprofen (ADVIL) tablet 400 mg (400 mg Oral Given 09/19/22 1423)    Initial Impression / Assessment and Plan / UC Course  I have reviewed the triage vital signs and the nursing notes.  Pertinent labs & imaging results that were available during my care of the patient were reviewed by me and considered in my medical decision making (see chart for details).     Skin tear cleansed and dressed by nursing staff.  Start mupirocin topically for 7 days.  Signs and symptoms of infection reviewed and patient verbalized understanding Patient declined x-ray of the left elbow. He was given 400 mg of ibuprofen in clinic for pain as requested. Reviewed lumbar x-ray showing L1 compression fracture.  Patient states pain is controlled with ibuprofen and he denies any red flag symptoms  including numbness/tingling/weakness of his lower extremities, bowel or bladder incontinence, saddle paresthesia.  Discussed ER evaluation today but he declined stating he wants to follow-up with his orthopedist at [facility].  Also discussed he can follow-up with [facility] tomorrow. Discussed rest and heat to the back as needed Follow-up PCP in 2 days for recheck Strict ER precautions reviewed and he verbalized understanding Final Clinical Impressions(s) / UC Diagnoses   Final diagnoses:  Acute left-sided low back pain without sciatica  Skin tear of left elbow without complication, initial encounter  Fall, initial encounter  Compression fracture of L1 vertebra, initial encounter [facility])     Discharge Instructions      Keep wound clean and dry.  Apply mupirocin ointment twice daily for 7 days Heat to low back as needed Rest and continue ibuprofen as needed If you are unable to get in with your orthopedist at [facility] please go to [facility] for further evaluation and treatment options Follow-up with PCP 2 to 3 days for recheck Please go to the emergency room if you have any worsening symptoms.  This includes but is not limited to uncontrolled pain, numbness/tingling/weakness of your lower extremities, bowel or bladder incontinence, or numbness in your groin.    ED Prescriptions     Medication Sig Dispense Auth. Provider   mupirocin ointment (BACTROBAN) 2 %  Apply 1 Application topically 2 (two) times daily for 7 days. 15 g Melynda Ripple, NP      PDMP not reviewed this encounter.   Melynda Ripple, NP 09/19/22 [phone]
